# Patient Record
Sex: Female | Born: 1960 | Race: White | Hispanic: No | Marital: Married | Smoking: Never smoker
Health system: Southern US, Community
[De-identification: ages and names within clinical notes are randomized; demographics above are authoritative.]

## PROBLEM LIST (undated history)

## (undated) DIAGNOSIS — E039 Hypothyroidism, unspecified: Secondary | ICD-10-CM

## (undated) DIAGNOSIS — F410 Panic disorder [episodic paroxysmal anxiety] without agoraphobia: Secondary | ICD-10-CM

## (undated) DIAGNOSIS — I10 Essential (primary) hypertension: Secondary | ICD-10-CM

## (undated) DIAGNOSIS — F329 Major depressive disorder, single episode, unspecified: Secondary | ICD-10-CM

## (undated) DIAGNOSIS — IMO0002 Reserved for concepts with insufficient information to code with codable children: Secondary | ICD-10-CM

## (undated) DIAGNOSIS — R87610 Atypical squamous cells of undetermined significance on cytologic smear of cervix (ASC-US): Secondary | ICD-10-CM

## (undated) DIAGNOSIS — R87810 Cervical high risk human papillomavirus (HPV) DNA test positive: Secondary | ICD-10-CM

## (undated) DIAGNOSIS — D369 Benign neoplasm, unspecified site: Secondary | ICD-10-CM

## (undated) DIAGNOSIS — F32A Depression, unspecified: Secondary | ICD-10-CM

## (undated) HISTORY — DX: Panic disorder (episodic paroxysmal anxiety): F41.0

## (undated) HISTORY — DX: Essential (primary) hypertension: I10

## (undated) HISTORY — DX: Reserved for concepts with insufficient information to code with codable children: IMO0002

## (undated) HISTORY — DX: Benign neoplasm, unspecified site: D36.9

## (undated) HISTORY — DX: Atypical squamous cells of undetermined significance on cytologic smear of cervix (ASC-US): R87.610

## (undated) HISTORY — DX: Cervical high risk human papillomavirus (HPV) DNA test positive: R87.810

## (undated) HISTORY — DX: Depression, unspecified: F32.A

## (undated) HISTORY — DX: Hypothyroidism, unspecified: E03.9

## (undated) HISTORY — DX: Major depressive disorder, single episode, unspecified: F32.9

---

## 2011-12-05 DIAGNOSIS — D369 Benign neoplasm, unspecified site: Secondary | ICD-10-CM

## 2011-12-05 HISTORY — DX: Benign neoplasm, unspecified site: D36.9

## 2013-09-03 ENCOUNTER — Encounter: Payer: Self-pay | Admitting: Adult Health

## 2013-09-03 ENCOUNTER — Ambulatory Visit (INDEPENDENT_AMBULATORY_CARE_PROVIDER_SITE_OTHER): Payer: PRIVATE HEALTH INSURANCE | Admitting: Adult Health

## 2013-09-03 VITALS — BP 114/70 | HR 70 | Temp 98.2°F | Resp 12 | Ht 64.75 in | Wt 143.5 lb

## 2013-09-03 DIAGNOSIS — Z79899 Other long term (current) drug therapy: Secondary | ICD-10-CM

## 2013-09-03 DIAGNOSIS — F419 Anxiety disorder, unspecified: Secondary | ICD-10-CM

## 2013-09-03 DIAGNOSIS — Z1239 Encounter for other screening for malignant neoplasm of breast: Secondary | ICD-10-CM

## 2013-09-03 DIAGNOSIS — F411 Generalized anxiety disorder: Secondary | ICD-10-CM

## 2013-09-03 MED ORDER — LOSARTAN POTASSIUM 50 MG PO TABS: 50.0000 mg | ORAL_TABLET | Freq: Every day | ORAL | Status: DC

## 2013-09-03 NOTE — Patient Instructions (Addendum)
  Schedule your mammogram. Remind them to send the report to my office  Thank you for choosing [place] [facility] for your health care needs.  Remember to activate your MyChart account so that you will have access to your medical records through email. The activation code is located at the end of this form.

## 2013-09-03 NOTE — Assessment & Plan Note (Signed)
Patient was started on Celexa for panic attacks/anxiety. She has been taking medication for many years and is requesting instructions on that coming off medication. She is currently taking 20 mg daily. Discussed side effects of withdrawal from Celexa and need to taper slowly. She will start by cutting Celexa dose in half and take this for approximately 2 weeks. Then she will taper further by cutting back dose in half again. Then she can take medication every other day. She will call with any question or concerns. Clinical experience suggests that discontinuation symptoms associated with SSRI withdrawal can be reduced by a slow tapering of the drug.

## 2013-09-03 NOTE — Progress Notes (Signed)
Subjective:    Patient ID: Sheri Garcia, female    DOB: 07-17-1961, 52 y.o.   MRN: [id number]  HPI  Patient presents to clinic to establish care. Previously followed in [place], [place]. She moved to this area recently when her husband accepted a job in [place]. She is feeling well. No concerns this visit. She reports hx of panic attacks and taking Celexa for same. Her symptoms have been well controlled and she would like to try coming off the Celexa.     Past Medical History  Diagnosis Date  . Depression     stable x 20 years  . Hypertension   . Thyroid disease     hypothyroid  . Colon polyps 2013    Repeat in 5 years  . Panic attacks     Celexa     History reviewed. No pertinent past surgical history.   Family History  Problem Relation Age of Onset  . Cancer Mother     lung cancer  . Hyperlipidemia Father   . Hypertension Father   . Stroke Father 72  . Cancer Maternal Grandmother     colon cancer  . Alcohol abuse Sister      History   Social History  . Marital Status: Married    Spouse Name: Anne Shutter "Butch"    Number of Children: 2  . Years of Education: 12   Occupational History  . English as a second language teacher   Social History Main Topics  . Smoking status: Never Smoker   . Smokeless tobacco: Never Used  . Alcohol Use: 1.2 oz/week    2 Glasses of wine per week  . Drug Use: No  . Sexual Activity: Not on file   Other Topics Concern  . Not on file   Social History Narrative   Sheri Garcia grew up in [place]. She currently lives at home with her husband. She has a son and daughter. Her and her husband have a cat. 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 She enjoys motorcycle riding.      Review of Systems  Constitutional: Negative.   HENT: Negative.   Eyes: Negative.   Respiratory: Negative.   Cardiovascular: Negative.   Gastrointestinal: Negative.   Endocrine: Negative.    Genitourinary: Negative.   Musculoskeletal: Negative.   Skin: Negative.   Allergic/Immunologic: Negative.   Neurological: Negative.   Hematological: Negative.   Psychiatric/Behavioral: Negative.        Objective:   Physical Exam  Constitutional: She is oriented to person, place, and time. She appears well-developed and well-nourished. No distress.  HENT:  Head: Normocephalic and atraumatic.  Right Ear: External ear normal.  Left Ear: External ear normal.  Nose: Nose normal.  Mouth/Throat: Oropharynx is clear and moist.  Eyes: Conjunctivae and EOM are normal. Pupils are equal, round, and reactive to light.  Neck: Normal range of motion. Neck supple. No tracheal deviation present. No thyromegaly present.  Cardiovascular: Normal rate, regular rhythm, normal heart sounds and intact distal pulses.  Exam reveals no gallop and no friction rub.   No murmur heard. Pulmonary/Chest: Effort normal and breath sounds normal. No respiratory distress. She has no wheezes. She has no rales. She exhibits no tenderness.  Abdominal: Soft. Bowel sounds are normal. She exhibits no distension and no mass. There is no tenderness. There is no rebound and no guarding.  Musculoskeletal: Normal range of motion. She exhibits no edema and no tenderness.  Lymphadenopathy:    She  has no cervical adenopathy.  Neurological: She is alert and oriented to person, place, and time. She has normal reflexes. No cranial nerve deficit. Coordination normal.  Skin: Skin is warm and dry.  Psychiatric: She has a normal mood and affect. Her behavior is normal. Judgment and thought content normal.    BP 114/70  Pulse 70  Temp(Src) 98.2 F (36.8 C) (Oral)  Resp 12  Ht 5' 4.75" (1.645 m)  Wt 143 lb 8 oz (65.091 kg)  BMI 24.05 kg/m2  SpO2 98%       Assessment & Plan:

## 2013-09-03 NOTE — Assessment & Plan Note (Signed)
Mammogram ordered. Patient will schedule her procedure

## 2013-09-18 ENCOUNTER — Encounter: Payer: Self-pay | Admitting: Emergency Medicine

## 2013-09-24 ENCOUNTER — Encounter: Payer: Self-pay | Admitting: Adult Health

## 2013-10-09 ENCOUNTER — Other Ambulatory Visit: Payer: Self-pay

## 2013-11-05 ENCOUNTER — Encounter: Payer: Self-pay | Admitting: Adult Health

## 2013-11-05 ENCOUNTER — Ambulatory Visit (INDEPENDENT_AMBULATORY_CARE_PROVIDER_SITE_OTHER): Payer: PRIVATE HEALTH INSURANCE | Admitting: Adult Health

## 2013-11-05 VITALS — BP 130/80 | HR 72 | Temp 98.5°F | Resp 14 | Wt 146.0 lb

## 2013-11-05 DIAGNOSIS — J329 Chronic sinusitis, unspecified: Secondary | ICD-10-CM | POA: Insufficient documentation

## 2013-11-05 DIAGNOSIS — J029 Acute pharyngitis, unspecified: Secondary | ICD-10-CM

## 2013-11-05 DIAGNOSIS — N390 Urinary tract infection, site not specified: Secondary | ICD-10-CM

## 2013-11-05 LAB — POCT RAPID STREP A (OFFICE): Rapid Strep A Screen: NEGATIVE

## 2013-11-05 MED ORDER — AMOXICILLIN-POT CLAVULANATE 875-125 MG PO TABS: 1.0000 | ORAL_TABLET | Freq: Two times a day (BID) | ORAL | Status: DC

## 2013-11-05 MED ORDER — FLUTICASONE PROPIONATE 50 MCG/ACT NA SUSP: 2.0000 | Freq: Every day | NASAL | Status: DC

## 2013-11-05 NOTE — Assessment & Plan Note (Signed)
Appears viral. Flonase nasal spray. Saline spray. Provided with prescription for Augmentin and instructed to only fill if she develops a fever, green secretions. Patient agreed.

## 2013-11-05 NOTE — Patient Instructions (Signed)
Start Flonase nasal spray - 2 sprays into each nostril daily I am giving you a prescription for Augmentin. Take only if you develop a fever, chills or secretions become green colored.  Sinusitis Sinusitis is redness, soreness, and swelling (inflammation) of the paranasal sinuses. Paranasal sinuses are air pockets within the bones of your face (beneath the eyes, the middle of the forehead, or above the eyes). In healthy paranasal sinuses, mucus is able to drain out, and air is able to circulate through them by way of your nose. However, when your paranasal sinuses are inflamed, mucus and air can become trapped. This can allow bacteria and other germs to grow and cause infection. Sinusitis can develop quickly and last only a short time (acute) or continue over a long period (chronic). Sinusitis that lasts for more than 12 weeks is considered chronic.  CAUSES  Causes of sinusitis include:  Allergies.  Structural abnormalities, such as displacement of the cartilage that separates your nostrils (deviated septum), which can decrease the air flow through your nose and sinuses and affect sinus drainage.  Functional abnormalities, such as when the small hairs (cilia) that line your sinuses and help remove mucus do not work properly or are not present. SYMPTOMS  Symptoms of acute and chronic sinusitis are the same. The primary symptoms are pain and pressure around the affected sinuses. Other symptoms include:  Upper toothache.  Earache.  Headache.  Bad breath.  Decreased sense of smell and taste.  A cough, which worsens when you are lying flat.  Fatigue.  Fever.  Thick drainage from your nose, which often is green and may contain pus (purulent).  Swelling and warmth over the affected sinuses. DIAGNOSIS  Your caregiver will perform a physical exam. During the exam, your caregiver may:  Look in your nose for signs of abnormal growths in your nostrils (nasal polyps).  Tap over the  affected sinus to check for signs of infection.  View the inside of your sinuses (endoscopy) with a special imaging device with a light attached (endoscope), which is inserted into your sinuses. If your caregiver suspects that you have chronic sinusitis, one or more of the following tests may be recommended:  Allergy tests.  Nasal culture A sample of mucus is taken from your nose and sent to a lab and screened for bacteria.  Nasal cytology A sample of mucus is taken from your nose and examined by your caregiver to determine if your sinusitis is related to an allergy. TREATMENT  Most cases of acute sinusitis are related to a viral infection and will resolve on their own within 10 days. Sometimes medicines are prescribed to help relieve symptoms (pain medicine, decongestants, nasal steroid sprays, or saline sprays).  However, for sinusitis related to a bacterial infection, your caregiver will prescribe antibiotic medicines. These are medicines that will help kill the bacteria causing the infection.  Rarely, sinusitis is caused by a fungal infection. In theses cases, your caregiver will prescribe antifungal medicine. For some cases of chronic sinusitis, surgery is needed. Generally, these are cases in which sinusitis recurs more than 3 times per year, despite other treatments. HOME CARE INSTRUCTIONS   Drink plenty of water. Water helps thin the mucus so your sinuses can drain more easily.  Use a humidifier.  Inhale steam 3 to 4 times a day (for example, sit in the bathroom with the shower running).  Apply a warm, moist washcloth to your face 3 to 4 times a day, or as directed by your  caregiver.  Use saline nasal sprays to help moisten and clean your sinuses.  Take over-the-counter or prescription medicines for pain, discomfort, or fever only as directed by your caregiver. SEEK IMMEDIATE MEDICAL CARE IF:  You have increasing pain or severe headaches.  You have nausea, vomiting, or  drowsiness.  You have swelling around your face.  You have vision problems.  You have a stiff neck.  You have difficulty breathing. MAKE SURE YOU:   Understand these instructions.  Will watch your condition.  Will get help right away if you are not doing well or get worse. Document Released: 11/20/2005 Document Revised: 02/12/2012 Document Reviewed: 12/05/2011 [facility] Patient Information 2014 [place], [place].

## 2013-11-05 NOTE — Progress Notes (Signed)
Pre visit review using our clinic review tool, if applicable. No additional management support is needed unless otherwise documented below in the visit note. 

## 2013-11-05 NOTE — Progress Notes (Signed)
   Subjective:    Patient ID: Sheri Garcia, female    DOB: 1961/05/10, 52 y.o.   MRN: [id number]  HPI  Patient is a pleasant 52 y/o female who presents to clinic with s/s sinus infection - sinus congestion, post nasal drip, sore throat. She denies fever, chills or cough. She reports that her secretions are yellowish.  Current Outpatient Prescriptions on File Prior to Visit  Medication Sig Dispense Refill  . aspirin 81 MG tablet Take 81 mg by mouth daily.      . Calcium Carbonate-Vitamin D (CALCIUM + D PO) Take by mouth.      . fish oil-omega-3 fatty acids 1000 MG capsule Take 2 g by mouth daily.      Marland Kitchen glucosamine-chondroitin 500-400 MG tablet Take 1 tablet by mouth 3 (three) times daily.      Marland Kitchen levothyroxine (SYNTHROID, LEVOTHROID) 75 MCG tablet Take 75 mcg by mouth daily before breakfast.      . losartan (COZAAR) 50 MG tablet Take 1 tablet (50 mg total) by mouth daily.  90 tablet  2  . vitamin E 400 UNIT capsule Take 400 Units by mouth daily.      . citalopram (CELEXA) 20 MG tablet Take 20 mg by mouth daily.       No current facility-administered medications on file prior to visit.     Review of Systems  Constitutional: Negative for fever and chills.  HENT: Positive for congestion, postnasal drip, rhinorrhea, sinus pressure and sore throat.   Respiratory: Negative.   Cardiovascular: Negative.        Objective:   Physical Exam  Constitutional: She appears well-developed and well-nourished. No distress.  HENT:  Head: Normocephalic and atraumatic.  Pharyngeal erythema. Noted posterior drainage  Cardiovascular: Normal rate and regular rhythm.   Pulmonary/Chest: Effort normal. No respiratory distress.  Psychiatric: She has a normal mood and affect. Her behavior is normal. Judgment and thought content normal.          Assessment & Plan:

## 2013-11-07 ENCOUNTER — Telehealth: Payer: Self-pay | Admitting: Adult Health

## 2013-11-07 ENCOUNTER — Other Ambulatory Visit: Payer: Self-pay

## 2013-11-07 MED ORDER — AMOXICILLIN-POT CLAVULANATE 875-125 MG PO TABS: 1.0000 | ORAL_TABLET | Freq: Two times a day (BID) | ORAL | Status: DC

## 2013-11-07 NOTE — Telephone Encounter (Signed)
The patient lost her prescription for amoxicillin-clavulanate (AUGMENTIN) 875-125 MG per tablet . She wants her new prescription sent to   [facility] [address] [facility]

## 2013-11-07 NOTE — Telephone Encounter (Signed)
rx resent and faxed

## 2013-11-07 NOTE — Telephone Encounter (Signed)
Rx  For Augmentin sent to [facility] [address] [place], [place]

## 2013-11-07 NOTE — Telephone Encounter (Signed)
Pt calling.  States [facility] has not received rx for augmentin.  Please resend.

## 2013-11-07 NOTE — Telephone Encounter (Signed)
Rx resent to pharmacy

## 2014-01-26 ENCOUNTER — Telehealth: Payer: Self-pay | Admitting: Adult Health

## 2014-01-26 NOTE — Telephone Encounter (Signed)
error 

## 2014-02-27 ENCOUNTER — Ambulatory Visit (INDEPENDENT_AMBULATORY_CARE_PROVIDER_SITE_OTHER): Payer: 59 | Admitting: Adult Health

## 2014-02-27 ENCOUNTER — Encounter: Payer: Self-pay | Admitting: Adult Health

## 2014-02-27 VITALS — BP 98/64 | HR 72 | Temp 98.0°F | Resp 14 | Ht 65.0 in | Wt 144.0 lb

## 2014-02-27 DIAGNOSIS — Z0000 Encounter for general adult medical examination without abnormal findings: Secondary | ICD-10-CM

## 2014-02-27 DIAGNOSIS — E039 Hypothyroidism, unspecified: Secondary | ICD-10-CM

## 2014-02-27 MED ORDER — CITALOPRAM HYDROBROMIDE 20 MG PO TABS: 20.0000 mg | ORAL_TABLET | Freq: Every day | ORAL | Status: DC

## 2014-02-27 MED ORDER — LOSARTAN POTASSIUM 50 MG PO TABS: 50.0000 mg | ORAL_TABLET | Freq: Every day | ORAL | Status: DC

## 2014-02-27 MED ORDER — LEVOTHYROXINE SODIUM 75 MCG PO TABS: 75.0000 ug | ORAL_TABLET | Freq: Every day | ORAL | Status: DC

## 2014-02-27 NOTE — Patient Instructions (Signed)
  Please have labs drawn at a [facility] draw site at your earliest convenience.  I will notify you of results once they're available.  Prescriptions were sent to your pharmacy with refills for one year.   Return in one year for your physical or sooner if necessary.  Colonoscopy next due 2019  Pap due December 2016  Mammogram in one year. They should notify you with a letter. If you do not hear from them please let me know and I will submit a referral.

## 2014-02-27 NOTE — Progress Notes (Signed)
Patient ID: Sheri Garcia, female   DOB: [birth year], 53 y.o.   MRN: [id number]    Subjective:    Patient ID: Sheri Garcia, female    DOB: 11-21-1961, 53 y.o.   MRN: [id number]  HPI  Annual Exam.  Past Medical History  Diagnosis Date  . Depression     stable x 20 years  . Hypertension   . Hypothyroidism   . Tubular adenoma 2013    Repeat colonoscopy in 5 years (2018)  . Panic attacks     Celexa  . 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 LGSIL (low grade squamous intraepithelial dysplasia)     negative HPV, s/p colposcopy 01/29/13    Current Outpatient Prescriptions on File Prior to Visit  Medication Sig Dispense Refill  . aspirin 81 MG tablet Take 81 mg by mouth daily.      . Calcium Carbonate-Vitamin D (CALCIUM + D PO) Take by mouth.      . citalopram (CELEXA) 20 MG tablet Take 20 mg by mouth daily.      . fish oil-omega-3 fatty acids 1000 MG capsule Take 2 g by mouth daily.      Marland Kitchen glucosamine-chondroitin 500-400 MG tablet Take 1 tablet by mouth 3 (three) times daily.      Marland Kitchen levothyroxine (SYNTHROID, LEVOTHROID) 75 MCG tablet Take 75 mcg by mouth daily before breakfast.      . losartan (COZAAR) 50 MG tablet Take 1 tablet (50 mg total) by mouth daily.  90 tablet  2  . vitamin E 400 UNIT capsule Take 400 Units by mouth daily.       No current facility-administered medications on file prior to visit.     Review of Systems  Constitutional: Negative.   HENT: Negative.   Eyes: Negative.   Respiratory: Negative.   Cardiovascular: Negative.   Gastrointestinal: Negative.   Endocrine: Negative.   Genitourinary: Negative.   Musculoskeletal: Negative.   Skin: Negative.   Allergic/Immunologic: Negative.   Neurological: Negative.   Hematological: Negative.   Psychiatric/Behavioral: Negative.        Objective:  BP 98/64  Pulse 72  Temp(Src) 98 F (36.7 C) (Oral)  Resp 14  Ht 5\' 5"  (1.651 m)  Wt 144 lb (65.318 kg)  BMI 23.96 kg/m2   SpO2 97%   Physical Exam  Constitutional: She is oriented to person, place, and time. She appears well-developed and well-nourished. No distress.  HENT:  Head: Normocephalic and atraumatic.  Right Ear: External ear normal.  Left Ear: External ear normal.  Nose: Nose normal.  Mouth/Throat: Oropharynx is clear and moist.  Eyes: Conjunctivae and EOM are normal. Pupils are equal, round, and reactive to light.  Neck: Normal range of motion. Neck supple. No tracheal deviation present. No thyromegaly present.  Cardiovascular: Normal rate, regular rhythm, normal heart sounds and intact distal pulses.  Exam reveals no gallop and no friction rub.   No murmur heard. Pulmonary/Chest: Effort normal and breath sounds normal. No respiratory distress. She has no wheezes. She has no rales.  Abdominal: Soft. Bowel sounds are normal. She exhibits no distension and no mass. There is no tenderness. There is no rebound and no guarding.  Musculoskeletal: Normal range of motion. She exhibits no edema and no tenderness.  Lymphadenopathy:    She has no cervical adenopathy.  Neurological: She is alert and oriented to person, place, and time. She has normal reflexes. No cranial nerve deficit. Coordination normal.  Skin: Skin is warm and dry.  Psychiatric: She has a normal mood and affect. Her behavior is normal. Judgment and thought content normal.      Assessment & Plan:   1. Routine general medical examination at a health care facility Normal physical exam including breast exam. PAP due 2016. Colonoscopy due 2019. Mammogram normal and repeat in 1 year. Ordered routine labs which she will have done at [place].  2. Hypothyroidism On levothyroxine. Check TSH. Refills sent. Continue to follow.

## 2014-02-27 NOTE — Progress Notes (Signed)
Pre visit review using our clinic review tool, if applicable. No additional management support is needed unless otherwise documented below in the visit note. 

## 2014-03-06 ENCOUNTER — Telehealth: Payer: Self-pay | Admitting: Adult Health

## 2014-03-06 NOTE — Telephone Encounter (Signed)
Lab results

## 2014-04-29 ENCOUNTER — Encounter: Payer: Self-pay | Admitting: Adult Health

## 2014-07-24 ENCOUNTER — Ambulatory Visit: Payer: 59 | Admitting: Internal Medicine

## 2014-08-13 ENCOUNTER — Other Ambulatory Visit: Payer: Self-pay | Admitting: *Deleted

## 2014-08-13 MED ORDER — CITALOPRAM HYDROBROMIDE 20 MG PO TABS: 20.0000 mg | ORAL_TABLET | Freq: Every day | ORAL | Status: AC

## 2014-08-13 MED ORDER — LEVOTHYROXINE SODIUM 75 MCG PO TABS: 75.0000 ug | ORAL_TABLET | Freq: Every day | ORAL | Status: DC

## 2014-08-13 MED ORDER — LOSARTAN POTASSIUM 50 MG PO TABS: 50.0000 mg | ORAL_TABLET | Freq: Every day | ORAL | Status: AC

## 2015-06-14 ENCOUNTER — Other Ambulatory Visit: Payer: Self-pay

## 2015-06-14 NOTE — Telephone Encounter (Signed)
Please advise, Patient not seen since 3.27.15. No labs since 2014.

## 2015-06-16 ENCOUNTER — Other Ambulatory Visit: Payer: Self-pay

## 2015-06-16 MED ORDER — LEVOTHYROXINE SODIUM 75 MCG PO TABS: 75.0000 ug | ORAL_TABLET | Freq: Every day | ORAL | Status: AC

## 2021-05-05 ENCOUNTER — Other Ambulatory Visit (HOSPITAL_COMMUNITY): Payer: Self-pay | Admitting: Neurology

## 2021-05-05 DIAGNOSIS — G3184 Mild cognitive impairment, so stated: Secondary | ICD-10-CM

## 2021-05-18 ENCOUNTER — Ambulatory Visit (HOSPITAL_COMMUNITY)
Admission: RE | Admit: 2021-05-18 | Discharge: 2021-05-18 | Disposition: A | Discharge status: Home / Self Care | Payer: 59 | Source: Ambulatory Visit | Attending: Neurology | Admitting: Neurology

## 2021-05-18 ENCOUNTER — Other Ambulatory Visit: Payer: Self-pay

## 2021-05-18 DIAGNOSIS — G3184 Mild cognitive impairment, so stated: Secondary | ICD-10-CM | POA: Diagnosis not present

## 2021-11-03 IMAGING — MR MR HEAD W/O CM
24 series · 47 of 48 positions shown · non-contrast
Comparison: None.

CLINICAL DATA: Mild cognitive impairment

EXAM:
MRI HEAD WITHOUT CONTRAST
TECHNIQUE: Multiplanar, multiecho pulse sequences of the brain and surrounding
structures were obtained without intravenous contrast.
Additionally, using NeuroQuant software a 3D volumetric analysis of
the brain was performed and is compared to a normative database
adjusted for age, gender and intracranial volume.

[Series 2: FLAIR · sagittal · 5.0mm · 0.47mm/px · 1 of 28 slices shown (1 of 2)]
[im 1/28]
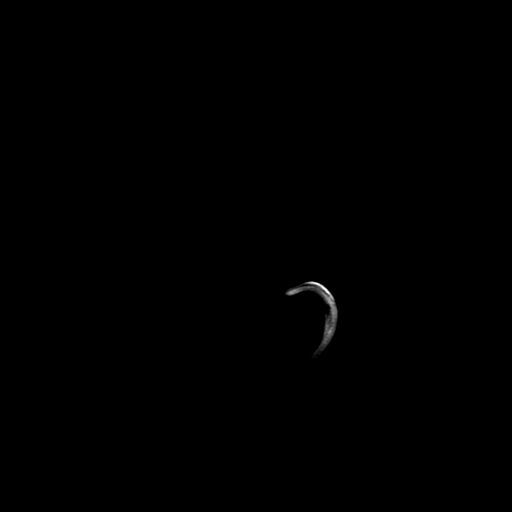

[Series 3: DWI · axial · 3.0mm · 0.94mm/px · 1 of 110 slices shown (1 of 2)]
[im 1/110]
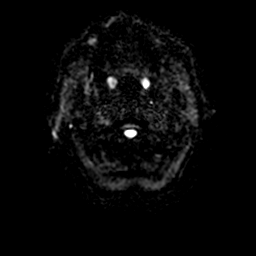

[Series 4: T2 · axial · 5.0mm · 0.47mm/px · 1 of 31 slices shown (1 of 2)]
[im 1/31]
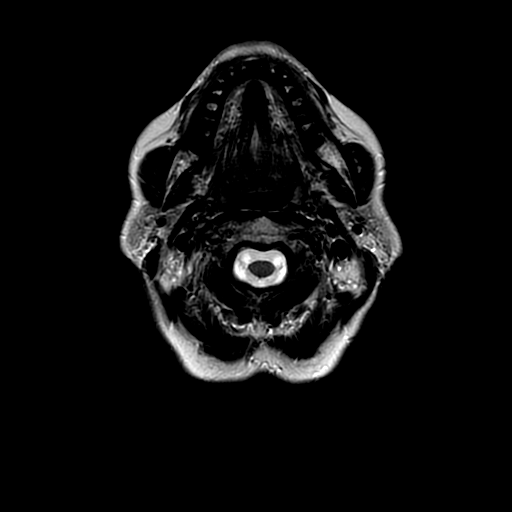

[Series 5: FLAIR · axial · 4.0mm · 0.41mm/px · 1 of 35 slices shown (2 of 2)]
[im 1/35]
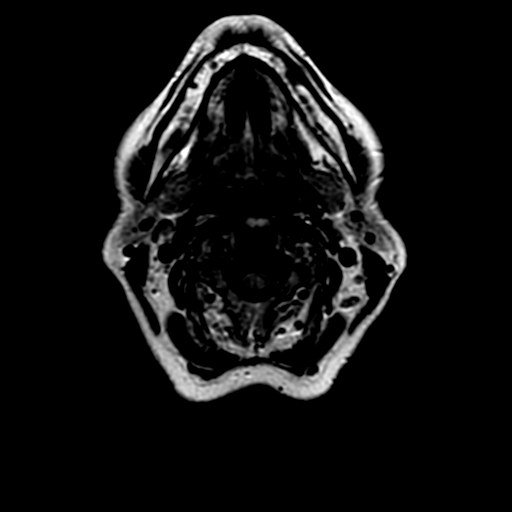

[Series 6: DWI · coronal · 4.0mm · 0.94mm/px · 1 of 84 slices shown (2 of 2)]
[im 1/84]
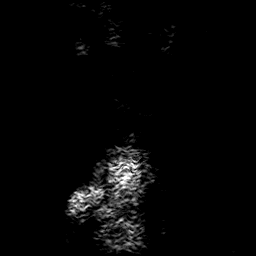

[Series 7: SWI · axial · 3.0mm · 0.47mm/px · 1 of 122 slices shown (1 of 2)]
[im 1/122]
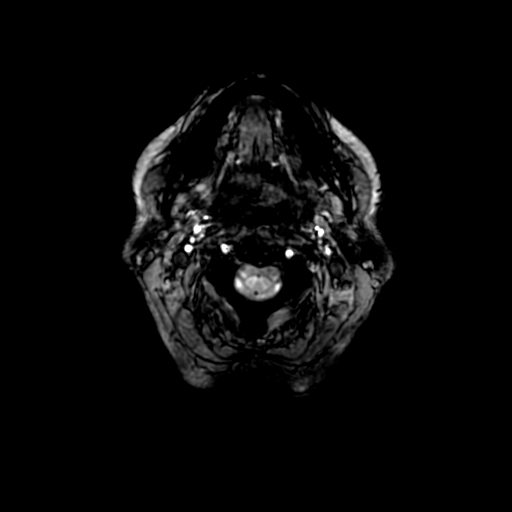

[Series 8: T1 · axial · non-contrast · 3.0mm · 0.94mm/px · 1 of 64 slices shown (1 of 2)]
[im 1/64]
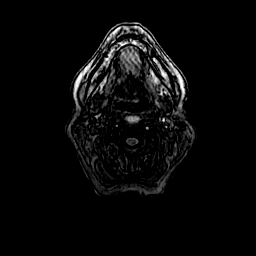

[Series 9: T1 · sagittal · 1.2mm · 0.94mm/px · 1 of 159 slices shown (2 of 2)]
[im 1/159]
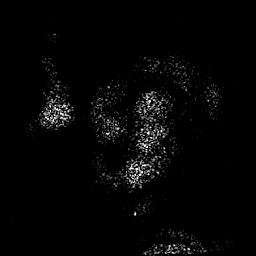

[Series 10: T2 · coronal · 5.0mm · 0.39mm/px · 1 of 34 slices shown (2 of 2)]
[im 1/34]
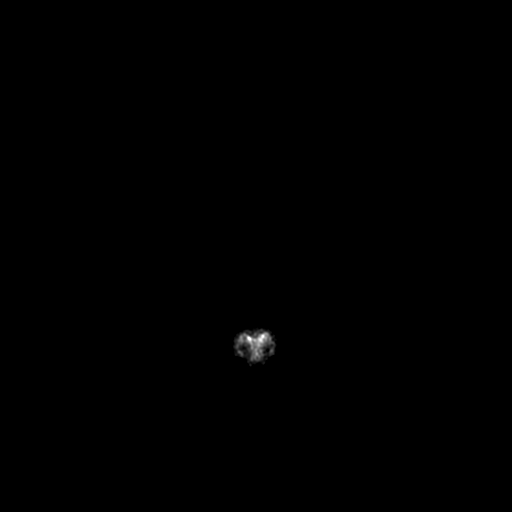

[Series 52: nqsegcorsc · 1.00mm/px · 2 of 220 slices shown (1 of 4)]
[im 1/220]
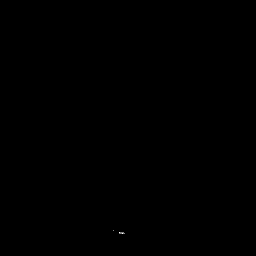
[im 220/220]
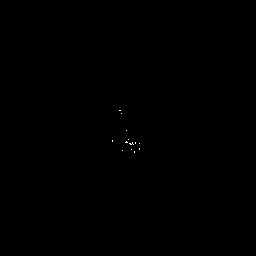

[Series 52: nqsegcorsc · 1.00mm/px · 3 of 220 slices shown (2 of 4)]
[im 1/220]
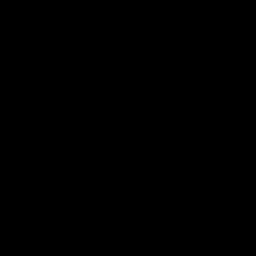
[im 110/220]
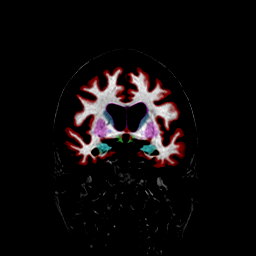
[im 220/220]
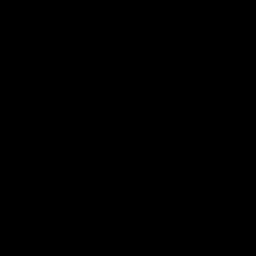

[Series 52: nqsegcorsc · 1.00mm/px · 3 of 220 slices shown (3 of 4)]
[im 1/220]
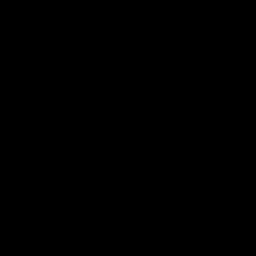
[im 110/220]
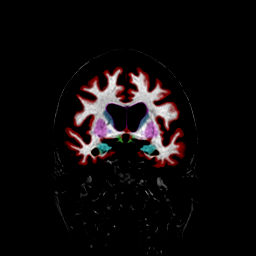
[im 220/220]
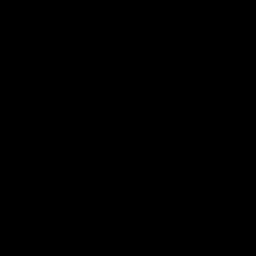

[Series 52: nqsegcorsc · 1.00mm/px · 3 of 220 slices shown (4 of 4)]
[im 1/220]
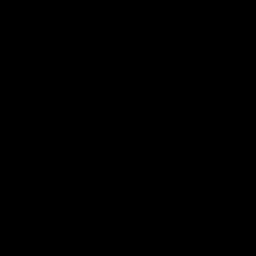
[im 110/220]
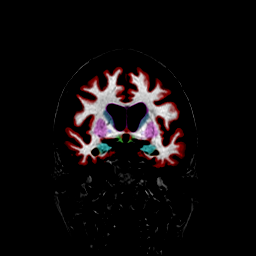
[im 220/220]
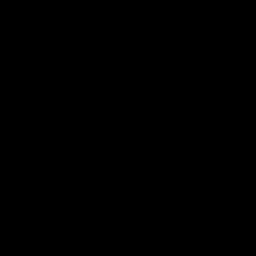

[Series 53: nqsegaxlsc · 1.00mm/px · 3 of 215 slices shown (1 of 4)]
[im 1/215]
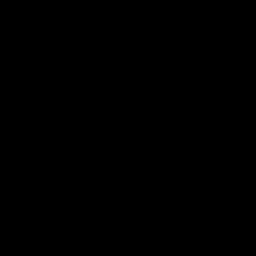
[im 108/215]
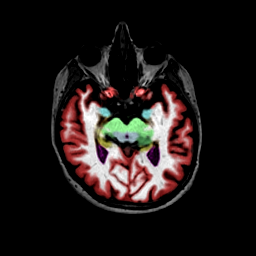
[im 215/215]
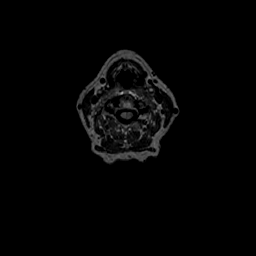

[Series 53: nqsegaxlsc · 1.00mm/px · 3 of 215 slices shown (2 of 4)]
[im 1/215]
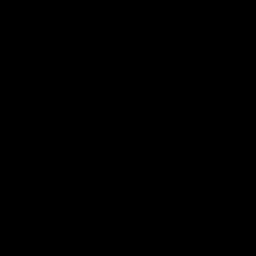
[im 108/215]
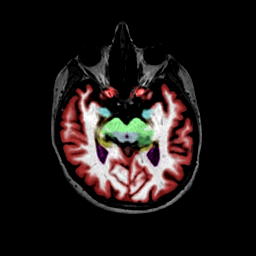
[im 215/215]
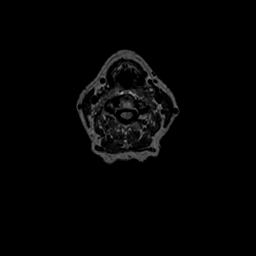

[Series 53: nqsegaxlsc · 1.00mm/px · 3 of 215 slices shown (3 of 4)]
[im 1/215]
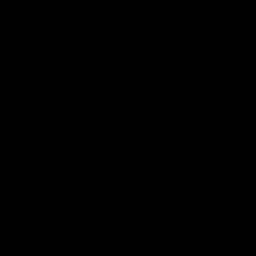
[im 108/215]
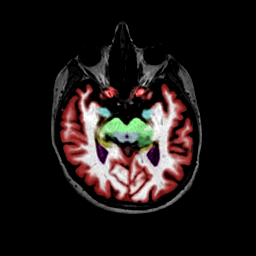
[im 215/215]
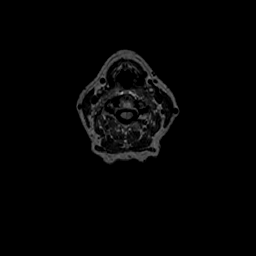

[Series 53: nqsegaxlsc · 1.00mm/px · 3 of 215 slices shown (4 of 4)]
[im 1/215]
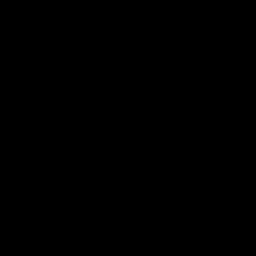
[im 108/215]
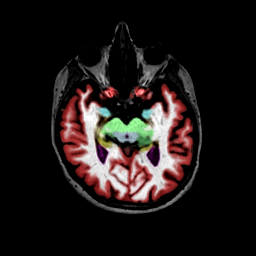
[im 215/215]
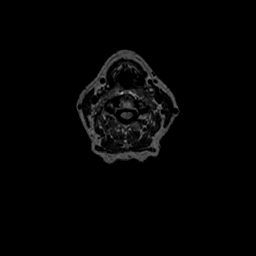

[Series 54: nqsegsagsc · 1.00mm/px · 3 of 195 slices shown (1 of 4)]
[im 1/195]
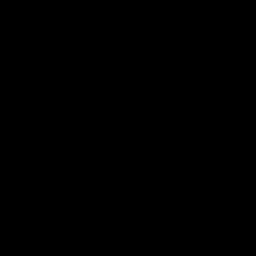
[im 98/195]
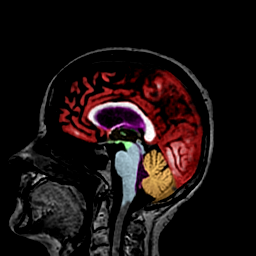
[im 195/195]
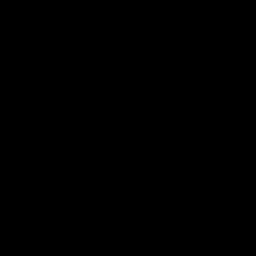

[Series 54: nqsegsagsc · 1.00mm/px · 3 of 195 slices shown (2 of 4)]
[im 1/195]
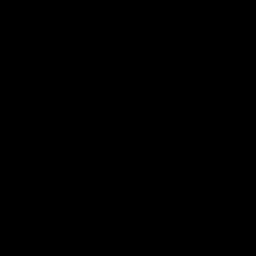
[im 98/195]
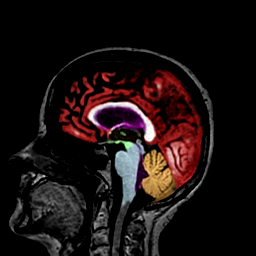
[im 195/195]
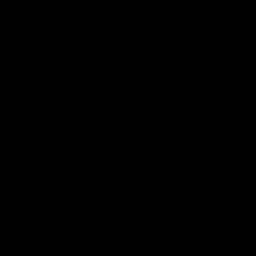

[Series 54: nqsegsagsc · 1.00mm/px · 3 of 195 slices shown (3 of 4)]
[im 1/195]
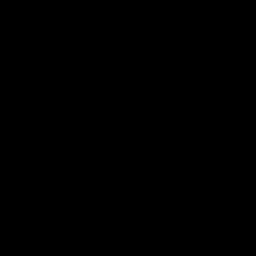
[im 98/195]
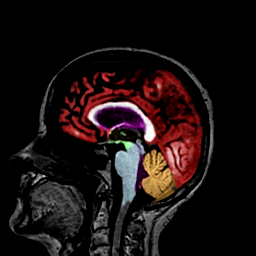
[im 195/195]
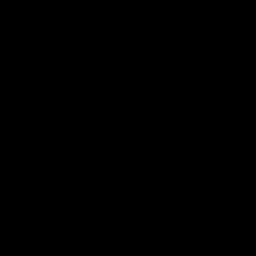

[Series 54: nqsegsagsc · 1.00mm/px · 2 of 195 slices shown (4 of 4)]
[im 1/195]
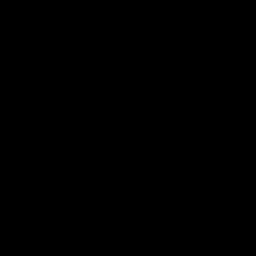
[im 98/195]
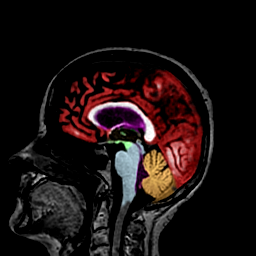

[Series 350: ADC · axial · 3.0mm · 0.94mm/px · 1 of 55 slices shown (1 of 2)]
[im 1/55]
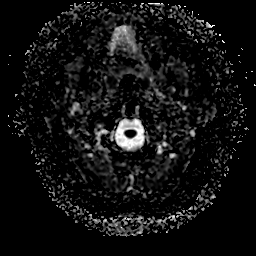

[Series 650: ADC · coronal · 4.0mm · 0.94mm/px · 1 of 41 slices shown (2 of 2)]
[im 1/41]
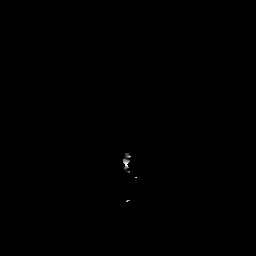

[Series 700: SWI · axial · 3.0mm · 0.47mm/px · z∈[-70,+112]mm · 2 of 119 slices shown (2 of 2)]
[im 1/119]
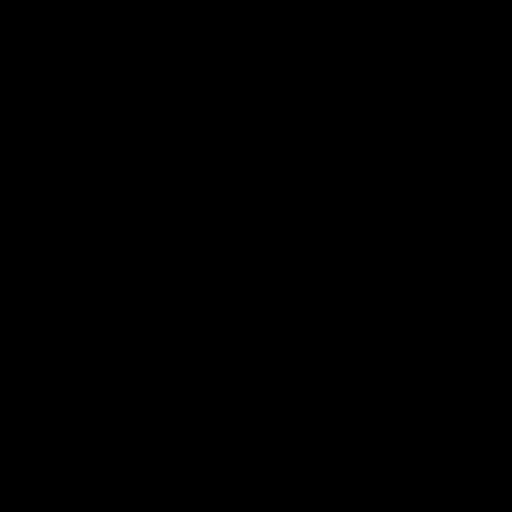
[im 119/119]
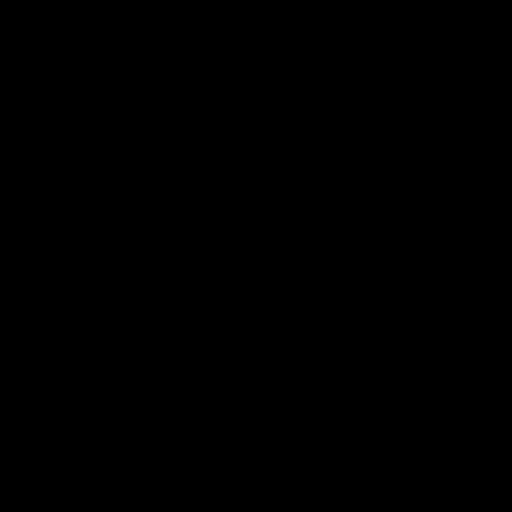

[47 of 48 positions shown; findings below may reference images not displayed]

FINDINGS: Brain: No acute infarct, mass effect or extra-axial collection. No
acute or chronic hemorrhage. There is multifocal hyperintense
T2-weighted signal within the white matter. Generalized volume loss
without a clear lobar predilection. The midline structures are
normal.

Vascular: Major flow voids are preserved.

Skull and upper cervical spine: Normal calvarium and skull base.
Visualized upper cervical spine and soft tissues are normal.

Sinuses/Orbits:No paranasal sinus fluid levels or advanced mucosal
thickening. No mastoid or middle ear effusion. Normal orbits.

NeuroQuant Findings:

Volumetric analysis of the brain was performed, with a fully
detailed report in [HOSPITAL] PACS. Briefly, the comparison with age and
gender matched reference reveals decreased whole brain volume with
enlarged ventricles.
IMPRESSION: 1. No acute intracranial abnormality.
2. Volume loss and findings of chronic small vessel disease
3. NeuroQuant volumetric analysis of the brain, see details on
[HOSPITAL] PACS.
